# Patient Record
Sex: Male | Born: 1940 | Race: White | Hispanic: No | Marital: Married | Smoking: Former smoker
Health system: Southern US, Community
[De-identification: ages and names within clinical notes are randomized; demographics above are authoritative.]

## PROBLEM LIST (undated history)

## (undated) DIAGNOSIS — F419 Anxiety disorder, unspecified: Secondary | ICD-10-CM

## (undated) DIAGNOSIS — I2699 Other pulmonary embolism without acute cor pulmonale: Secondary | ICD-10-CM

## (undated) DIAGNOSIS — E7800 Pure hypercholesterolemia, unspecified: Secondary | ICD-10-CM

## (undated) DIAGNOSIS — J449 Chronic obstructive pulmonary disease, unspecified: Secondary | ICD-10-CM

## (undated) DIAGNOSIS — K259 Gastric ulcer, unspecified as acute or chronic, without hemorrhage or perforation: Secondary | ICD-10-CM

## (undated) DIAGNOSIS — K219 Gastro-esophageal reflux disease without esophagitis: Secondary | ICD-10-CM

## (undated) HISTORY — PX: HEMORRHOID SURGERY: SHX153

## (undated) HISTORY — PX: APPENDECTOMY: SHX54

---

## 2009-10-15 ENCOUNTER — Ambulatory Visit: Payer: Self-pay | Admitting: Radiology

## 2009-10-15 ENCOUNTER — Emergency Department (HOSPITAL_BASED_OUTPATIENT_CLINIC_OR_DEPARTMENT_OTHER): Admission: EM | Admit: 2009-10-15 | Discharge: 2009-10-15 | Payer: Self-pay | Admitting: Emergency Medicine

## 2011-03-30 LAB — CBC
HCT: 44.7 % (ref 39.0–52.0)
MCHC: 33.8 g/dL (ref 30.0–36.0)
MCV: 86.4 fL (ref 78.0–100.0)

## 2011-03-30 LAB — BASIC METABOLIC PANEL
BUN: 16 mg/dL (ref 6–23)
CO2: 30 mEq/L (ref 19–32)
Calcium: 9.2 mg/dL (ref 8.4–10.5)
GFR calc non Af Amer: >60 mL/min (ref >60)
Sodium: 143 mEq/L (ref 135–145)

## 2011-03-30 LAB — POCT CARDIAC MARKERS
CKMB, poc: 1.1 ng/mL (ref 1.0–8.0)
Myoglobin, poc: 90.4 ng/mL (ref 12–200)

## 2011-03-30 LAB — DIFFERENTIAL
Basophils Absolute: 0.1 10*3/uL (ref 0.0–0.1)
Monocytes Relative: 6 % (ref 3–12)
Neutro Abs: 4.3 10*3/uL (ref 1.7–7.7)

## 2012-12-24 ENCOUNTER — Emergency Department (HOSPITAL_BASED_OUTPATIENT_CLINIC_OR_DEPARTMENT_OTHER)
Admission: EM | Admit: 2012-12-24 | Discharge: 2012-12-25 | Disposition: A | Discharge status: Home / Self Care | Payer: Medicare Other | Attending: Emergency Medicine | Admitting: Emergency Medicine

## 2012-12-24 ENCOUNTER — Encounter (HOSPITAL_BASED_OUTPATIENT_CLINIC_OR_DEPARTMENT_OTHER): Payer: Self-pay

## 2012-12-24 ENCOUNTER — Emergency Department (HOSPITAL_BASED_OUTPATIENT_CLINIC_OR_DEPARTMENT_OTHER): Payer: Medicare Other

## 2012-12-24 DIAGNOSIS — E278 Other specified disorders of adrenal gland: Secondary | ICD-10-CM | POA: Insufficient documentation

## 2012-12-24 DIAGNOSIS — J4489 Other specified chronic obstructive pulmonary disease: Secondary | ICD-10-CM | POA: Insufficient documentation

## 2012-12-24 DIAGNOSIS — K259 Gastric ulcer, unspecified as acute or chronic, without hemorrhage or perforation: Secondary | ICD-10-CM | POA: Insufficient documentation

## 2012-12-24 DIAGNOSIS — Z7901 Long term (current) use of anticoagulants: Secondary | ICD-10-CM | POA: Insufficient documentation

## 2012-12-24 DIAGNOSIS — R109 Unspecified abdominal pain: Secondary | ICD-10-CM

## 2012-12-24 DIAGNOSIS — Z79899 Other long term (current) drug therapy: Secondary | ICD-10-CM | POA: Insufficient documentation

## 2012-12-24 DIAGNOSIS — Z86711 Personal history of pulmonary embolism: Secondary | ICD-10-CM | POA: Insufficient documentation

## 2012-12-24 DIAGNOSIS — K219 Gastro-esophageal reflux disease without esophagitis: Secondary | ICD-10-CM | POA: Insufficient documentation

## 2012-12-24 DIAGNOSIS — F411 Generalized anxiety disorder: Secondary | ICD-10-CM | POA: Insufficient documentation

## 2012-12-24 DIAGNOSIS — J449 Chronic obstructive pulmonary disease, unspecified: Secondary | ICD-10-CM | POA: Insufficient documentation

## 2012-12-24 DIAGNOSIS — R1084 Generalized abdominal pain: Secondary | ICD-10-CM | POA: Insufficient documentation

## 2012-12-24 DIAGNOSIS — Z87891 Personal history of nicotine dependence: Secondary | ICD-10-CM | POA: Insufficient documentation

## 2012-12-24 DIAGNOSIS — E7800 Pure hypercholesterolemia, unspecified: Secondary | ICD-10-CM | POA: Insufficient documentation

## 2012-12-24 HISTORY — DX: Gastro-esophageal reflux disease without esophagitis: K21.9

## 2012-12-24 HISTORY — DX: Chronic obstructive pulmonary disease, unspecified: J44.9

## 2012-12-24 HISTORY — DX: Other pulmonary embolism without acute cor pulmonale: I26.99

## 2012-12-24 HISTORY — DX: Gastric ulcer, unspecified as acute or chronic, without hemorrhage or perforation: K25.9

## 2012-12-24 HISTORY — DX: Anxiety disorder, unspecified: F41.9

## 2012-12-24 HISTORY — DX: Pure hypercholesterolemia, unspecified: E78.00

## 2012-12-24 LAB — CBC WITH DIFFERENTIAL/PLATELET
Basophils Relative: 1 % (ref 0–1)
HCT: 42.1 % (ref 39.0–52.0)
Lymphs Abs: 2.1 10*3/uL (ref 0.7–4.0)
MCV: 79 fL (ref 78.0–100.0)
Monocytes Relative: 9 % (ref 3–12)
Neutrophils Relative %: 55 % (ref 43–77)
RDW: 14.7 % (ref 11.5–15.5)
WBC: 6.7 10*3/uL (ref 4.0–10.5)

## 2012-12-24 MED ORDER — DIPHENHYDRAMINE HCL 50 MG/ML IJ SOLN
INTRAMUSCULAR | Status: AC
  Administered 2012-12-24: 12.5 mg via INTRAVENOUS
  Filled 2012-12-24: qty 1

## 2012-12-24 MED ORDER — SODIUM CHLORIDE 0.9 % IV SOLN
Freq: Once | INTRAVENOUS | Status: AC
  Administered 2012-12-24: 23:00:00 via INTRAVENOUS

## 2012-12-24 MED ORDER — DIPHENHYDRAMINE HCL 50 MG/ML IJ SOLN
12.5000 mg | Freq: Once | INTRAMUSCULAR | Status: AC
  Administered 2012-12-24: 12.5 mg via INTRAVENOUS

## 2012-12-24 MED ORDER — FENTANYL CITRATE 0.05 MG/ML IJ SOLN
50.0000 ug | Freq: Once | INTRAMUSCULAR | Status: AC
  Administered 2012-12-24: 50 ug via INTRAVENOUS
  Filled 2012-12-24: qty 2

## 2012-12-24 NOTE — ED Provider Notes (Addendum)
History     CSN: [id number]  Arrival date & time 12/24/12  1946   First MD Initiated Contact with Patient 12/24/12 2301      Chief Complaint  Patient presents with  . Abdominal Pain    (Consider location/radiation/quality/duration/timing/severity/associated sxs/prior treatment) Patient is a 71 y.o. male presenting with abdominal pain. The history is provided by the patient.  Abdominal Pain The primary symptoms of the illness include abdominal pain. The primary symptoms of the illness do not include fever, fatigue, shortness of breath, nausea, vomiting, diarrhea, hematemesis, hematochezia, dysuria or vaginal discharge. The current episode started 6 to 12 hours ago. The onset of the illness was sudden. The problem has not changed since onset. The abdominal pain began 6 to 12 hours ago. The pain came on suddenly. The abdominal pain has been unchanged since its onset. The abdominal pain is generalized. The abdominal pain does not radiate. The abdominal pain is relieved by nothing.  The patient states that she believes she is currently not pregnant. The patient has not had a change in bowel habit. Risk factors for an acute abdominal problem include being elderly. Symptoms associated with the illness do not include chills or anorexia. Significant associated medical issues include PUD.    Past Medical History  Diagnosis Date  . GERD (gastroesophageal reflux disease)   . Gastric ulcer   . Pulmonary embolism   . Anxiety   . COPD (chronic obstructive pulmonary disease)   . High cholesterol     Past Surgical History  Procedure Date  . Appendectomy   . Hemorrhoid surgery     No family history on file.  History  Substance Use Topics  . Smoking status: Former Games developer  . Smokeless tobacco: Not on file  . Alcohol Use: No      Review of Systems  Constitutional: Negative for fever, chills and fatigue.  Respiratory: Negative for shortness of breath.   Gastrointestinal: Positive for  abdominal pain. Negative for nausea, vomiting, diarrhea, hematochezia, anorexia and hematemesis.  Genitourinary: Negative for dysuria and vaginal discharge.  All other systems reviewed and are negative.    Allergies  Penicillins  Home Medications   Current Outpatient Rx  Name  Route  Sig  Dispense  Refill  . ALBUTEROL IN   Inhalation   Inhale into the lungs.         Garlon Hatchet HFA IN   Inhalation   Inhale into the lungs.         . OMEPRAZOLE PO   Oral   Take by mouth.         Marland Kitchen PRAVASTATIN SODIUM PO   Oral   Take by mouth.         Marland Kitchen TIOTROPIUM BROMIDE MONOHYDRATE 18 MCG IN CAPS   Inhalation   Place 18 mcg into inhaler and inhale daily.         . WARFARIN SODIUM PO   Oral   Take by mouth.           BP 120/81  Pulse 75  Temp 97.9 F (36.6 C) (Oral)  Resp 20  Ht 6\' 1"  (1.854 m)  Wt 161 lb (73.029 kg)  BMI 21.24 kg/m2  SpO2 95%  Physical Exam  Constitutional: He is oriented to person, place, and time. He appears well-developed and well-nourished. No distress.  HENT:  Head: Normocephalic and atraumatic.  Mouth/Throat: Oropharynx is clear and moist.  Eyes: Conjunctivae normal are normal. Pupils are equal, round, and reactive to light.  Neck: Normal range of motion. Neck supple.  Cardiovascular: Normal rate, regular rhythm and intact distal pulses.   Pulmonary/Chest: Effort normal and breath sounds normal. He has no wheezes. He has no rales.  Abdominal: Soft. Bowel sounds are normal. There is no tenderness. There is no rebound and no guarding.  Musculoskeletal: Normal range of motion.  Neurological: He is alert and oriented to person, place, and time.  Skin: Skin is warm and dry.  Psychiatric: He has a normal mood and affect.    ED Course  Procedures (including critical care time)   Labs Reviewed  CBC WITH DIFFERENTIAL  COMPREHENSIVE METABOLIC PANEL  LIPASE, BLOOD  URINALYSIS, ROUTINE W REFLEX MICROSCOPIC  PROTIME-INR   No results  found.   No diagnosis found.    MDM  Pain likely from coughing to get corn bread he choked on out of esophagus.  Pain free.  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  Patient and family verbalize understanding and agree to follow up.  Liquid diet until endoscopy.  Patient will require endoscopy.  Patient and family verbalize understanding and agree to follow up        Olar Santini K Rochele Lueck-Rasch, MD 12/25/12 0111  Marvin Grabill K Laney Louderback-Rasch, MD 12/25/12 [phone]

## 2012-12-24 NOTE — ED Notes (Signed)
C/o abd pain after getting choked on food tonight approx 530pm-pt no resp distress

## 2012-12-25 LAB — COMPREHENSIVE METABOLIC PANEL
ALT: 16 U/L (ref 0–53)
AST: 21 U/L (ref 0–37)
Albumin: 3.3 g/dL — ABNORMAL LOW (ref 3.5–5.2)
Alkaline Phosphatase: 96 U/L (ref 39–117)
BUN: 16 mg/dL (ref 6–23)
CO2: 25 mEq/L (ref 19–32)
Creatinine, Ser: 1 mg/dL (ref 0.50–1.35)
Potassium: 3.9 mEq/L (ref 3.5–5.1)
Total Protein: 6.9 g/dL (ref 6.0–8.3)

## 2012-12-25 LAB — URINALYSIS, ROUTINE W REFLEX MICROSCOPIC
Glucose, UA: NEGATIVE mg/dL
Hgb urine dipstick: NEGATIVE
pH: 5.5 (ref 5.0–8.0)

## 2012-12-25 LAB — LIPASE, BLOOD: Lipase: 21 U/L (ref 11–59)

## 2012-12-25 MED ORDER — GI COCKTAIL ~~LOC~~
ORAL | Status: AC
  Administered 2012-12-25: 30 mL via ORAL
  Filled 2012-12-25: qty 30

## 2012-12-25 MED ORDER — IOHEXOL 300 MG/ML  SOLN
50.0000 mL | Freq: Once | INTRAMUSCULAR | Status: AC | PRN
  Administered 2012-12-25: 50 mL via ORAL

## 2012-12-25 MED ORDER — IOHEXOL 300 MG/ML  SOLN
100.0000 mL | Freq: Once | INTRAMUSCULAR | Status: AC | PRN
  Administered 2012-12-25: 100 mL via INTRAVENOUS

## 2012-12-25 MED ORDER — GI COCKTAIL ~~LOC~~
30.0000 mL | Freq: Once | ORAL | Status: AC
  Administered 2012-12-25: 30 mL via ORAL

## 2012-12-25 MED ORDER — SUCRALFATE 1 GM/10ML PO SUSP: 1.0000 g | Freq: Four times a day (QID) | ORAL | Status: AC

## 2012-12-25 NOTE — ED Notes (Signed)
Patient called to state that he can not afford the medication that was prescribed.  Chart reviewed with Dr. Oletta Lamas.  No alterative medication to the sucralfate.  Could use OTC pepcid or gas x .  Called placed back to the pt, with this info.  Pt stated that he was able to get 1/2 of the prescription filled.  Encouraged to f/u with GI for further evaluation.

## 2020-06-25 ENCOUNTER — Other Ambulatory Visit: Payer: Self-pay

## 2020-06-25 ENCOUNTER — Emergency Department (HOSPITAL_BASED_OUTPATIENT_CLINIC_OR_DEPARTMENT_OTHER)
Admission: EM | Admit: 2020-06-25 | Discharge: 2020-06-25 | Disposition: A | Discharge status: Home / Self Care | Payer: Medicare Other | Attending: Emergency Medicine | Admitting: Emergency Medicine

## 2020-06-25 ENCOUNTER — Emergency Department (HOSPITAL_BASED_OUTPATIENT_CLINIC_OR_DEPARTMENT_OTHER): Payer: Medicare Other

## 2020-06-25 ENCOUNTER — Encounter (HOSPITAL_BASED_OUTPATIENT_CLINIC_OR_DEPARTMENT_OTHER): Payer: Self-pay | Admitting: *Deleted

## 2020-06-25 DIAGNOSIS — S0990XA Unspecified injury of head, initial encounter: Secondary | ICD-10-CM | POA: Diagnosis present

## 2020-06-25 DIAGNOSIS — Y998 Other external cause status: Secondary | ICD-10-CM | POA: Diagnosis not present

## 2020-06-25 DIAGNOSIS — Y9389 Activity, other specified: Secondary | ICD-10-CM | POA: Diagnosis not present

## 2020-06-25 DIAGNOSIS — Y9289 Other specified places as the place of occurrence of the external cause: Secondary | ICD-10-CM | POA: Diagnosis not present

## 2020-06-25 DIAGNOSIS — Z87891 Personal history of nicotine dependence: Secondary | ICD-10-CM | POA: Insufficient documentation

## 2020-06-25 DIAGNOSIS — R519 Headache, unspecified: Secondary | ICD-10-CM | POA: Insufficient documentation

## 2020-06-25 DIAGNOSIS — W19XXXA Unspecified fall, initial encounter: Secondary | ICD-10-CM | POA: Diagnosis not present

## 2020-06-25 LAB — BASIC METABOLIC PANEL
Anion gap: 9 (ref 5–15)
BUN: 21 mg/dL (ref 8–23)
CO2: 28 mmol/L (ref 22–32)
Calcium: 9.2 mg/dL (ref 8.9–10.3)
Chloride: 103 mmol/L (ref 98–111)
Creatinine, Ser: 1.07 mg/dL (ref 0.61–1.24)
GFR calc Af Amer: >60 mL/min (ref >60)
GFR calc non Af Amer: >60 mL/min (ref >60)
Glucose, Bld: 101 mg/dL — ABNORMAL HIGH (ref 70–99)
Potassium: 4.8 mmol/L (ref 3.5–5.1)
Sodium: 140 mmol/L (ref 135–145)

## 2020-06-25 LAB — CBC WITH DIFFERENTIAL/PLATELET
Abs Immature Granulocytes: 0.03 10*3/uL (ref 0.00–0.07)
Basophils Absolute: 0.1 10*3/uL (ref 0.0–0.1)
Basophils Relative: 1 %
Eosinophils Absolute: 0.1 10*3/uL (ref 0.0–0.5)
Eosinophils Relative: 2 %
HCT: 46.9 % (ref 39.0–52.0)
Hemoglobin: 14.9 g/dL (ref 13.0–17.0)
Immature Granulocytes: 1 %
Lymphocytes Relative: 36 %
Lymphs Abs: 2.4 10*3/uL (ref 0.7–4.0)
MCH: 26.4 pg (ref 26.0–34.0)
MCHC: 31.8 g/dL (ref 30.0–36.0)
MCV: 83 fL (ref 80.0–100.0)
Monocytes Absolute: 0.5 10*3/uL (ref 0.1–1.0)
Monocytes Relative: 8 %
Neutro Abs: 3.5 10*3/uL (ref 1.7–7.7)
Neutrophils Relative %: 52 %
Platelets: 205 10*3/uL (ref 150–400)
RBC: 5.65 MIL/uL (ref 4.22–5.81)
RDW: 14.5 % (ref 11.5–15.5)
WBC: 6.6 10*3/uL (ref 4.0–10.5)
nRBC: 0 % (ref 0.0–0.2)

## 2020-06-25 LAB — SEDIMENTATION RATE: Sed Rate: 11 mm/hr (ref 0–16)

## 2020-06-25 MED ORDER — ACETAMINOPHEN 325 MG PO TABS
650.0000 mg | ORAL_TABLET | Freq: Once | ORAL | Status: AC
  Administered 2020-06-25: 650 mg via ORAL
  Filled 2020-06-25: qty 2

## 2020-06-25 MED ORDER — METHOCARBAMOL 500 MG PO TABS: 500.0000 mg | ORAL_TABLET | Freq: Two times a day (BID) | ORAL | 0 refills | Status: AC | PRN

## 2020-06-25 NOTE — ED Triage Notes (Signed)
Pt co head injury x 3 weeks ago , h/a x 3 days

## 2020-06-25 NOTE — Discharge Instructions (Signed)
Your head CT and laboratory work-up here today was reassuring.  However, your ESR (inflammatory marker) is still pending.  I will call you when it results as you were eager to leave.  However, if it is greater than 50 you will need to need to see ophthalmology to arrange for a biopsy.  Please call the ophthalmologist listed above.  Otherwise, please take Tylenol.  You were given a prescription for Robaxin which is a muscle relaxer.  You should not drive, work, consume alcohol, or operate machinery while taking this medication as it can make you very drowsy.  Please be VERY careful because this puts you at risk for falls.  Please return to the ED or seek immediate medical attention should experience any new or symptoms.

## 2020-06-25 NOTE — ED Notes (Signed)
Fell two weeks ago, fell off bed, head hit wall. Has pain over left eye and left side of face. States pain began 3 days ago. Very alert and oriented x 4, no LOC, no dizziness, denies any N/V. MAE x 4

## 2020-06-25 NOTE — ED Provider Notes (Addendum)
[facility] EMERGENCY DEPARTMENT Provider Note   CSN: [id number] Arrival date & time: 06/25/20  1500     History Chief Complaint  Patient presents with  . Head Injury    Brian Reynolds is a 79 y.o. male with no significant past medical history presents the ED with a 3-day history of left-sided headache with extension down left side of face and neck.  He describes his pain as 8 out of 10 and aching 'twinge" in his neck.  He feels as though he has a kink in his neck and is wondering if muscle relaxants may help.  He states that he has never had this type of headache before.  He does report that he had a small fall approximately 3 weeks ago where he hit the top of his head on the wall.  He denies any LOC.  He is not on any blood thinners.  He denies any other injury or precipitating event.  Patient denies any fevers or chills, recent illness, meningismus, difficulty eating or drinking, trismus, difficulty swallowing, ear pain, blurred vision, weakness or numbness, other focal neurologic deficits, or other symptoms.  HPI     Past Medical History:  Diagnosis Date  . Anxiety   . COPD (chronic obstructive pulmonary disease) ([place])   . Gastric ulcer   . GERD (gastroesophageal reflux disease)   . High cholesterol   . Pulmonary embolism ([facility])     There are no problems to display for this patient.   Past Surgical History:  Procedure Laterality Date  . APPENDECTOMY    . HEMORRHOID SURGERY         No family history on file.  Social History   Tobacco Use  . Smoking status: Former Smoker  Substance Use Topics  . Alcohol use: No  . Drug use: Not on file    Home Medications Prior to Admission medications   Medication Sig Start Date End Date Taking? Authorizing Provider  ALBUTEROL IN Inhale into the lungs.    [provider]  Fluticasone-Salmeterol (ADVAIR HFA IN) Inhale into the lungs.    [provider]  methocarbamol (ROBAXIN) 500 MG tablet Take 1  tablet (500 mg total) by mouth 2 (two) times daily as needed for muscle spasms. 06/25/20   Corena Herter, PA-C  OMEPRAZOLE PO Take by mouth.    [provider]  PRAVASTATIN SODIUM PO Take by mouth.    [provider]  sucralfate (CARAFATE) 1 GM/10ML suspension Take 10 mLs (1 g total) by mouth 4 (four) times daily. 12/25/12   Palumbo, April, MD  tiotropium (SPIRIVA) 18 MCG inhalation capsule Place 18 mcg into inhaler and inhale daily.    [provider]  WARFARIN SODIUM PO Take by mouth.    [provider]    Allergies    Penicillins  Review of Systems   Review of Systems  Constitutional: Negative for fever.  Musculoskeletal: Positive for neck stiffness.  Skin: Negative for color change and rash.  Neurological: Positive for headaches. Negative for facial asymmetry, weakness and numbness.    Physical Exam Updated Vital Signs BP 129/85 (BP Location: Right Arm)   Pulse 76   Temp 98.7 F (37.1 C) (Oral)   Resp 18   Ht 6' 1"  (1.854 m)   Wt 78 kg   SpO2 100%   BMI 22.69 kg/m   Physical Exam Vitals and nursing note reviewed. Exam conducted with a chaperone present.  Constitutional:      General: He  is not in acute distress.    Appearance: Normal appearance. He is not ill-appearing.  HENT:     Head: Normocephalic and atraumatic.     Ears:     Comments: Normal.  No mastoid tenderness palpation. Eyes:     General: No scleral icterus.    Extraocular Movements: Extraocular movements intact.     Conjunctiva/sclera: Conjunctivae normal.     Pupils: Pupils are equal, round, and reactive to light.  Neck:     Comments: No midline cervical tenderness palpation.  No meningismus.  Mild left-sided facial/neck discomfort with palpation over area of temporal artery and with turning head in contralateral direction.  No redness, erythema, warmth, or other overlying skin changes. Cardiovascular:     Rate and Rhythm: Normal rate and regular rhythm.     Pulses:  Normal pulses.     Heart sounds: Normal heart sounds.  Pulmonary:     Effort: Pulmonary effort is normal.     Breath sounds: Normal breath sounds.  Skin:    General: Skin is dry.     Capillary Refill: Capillary refill takes less than 2 seconds.  Neurological:     Mental Status: He is alert.     GCS: GCS eye subscore is 4. GCS verbal subscore is 5. GCS motor subscore is 6.     Comments: CN II through XII grossly intact.  Alert and oriented x3.  Negative Romberg and cerebellar exams.  Move all extremities.  Strength intact against resistance.  Sensation intact throughout.  PERRL and EOM intact.  No nystagmus.  Psychiatric:        Mood and Affect: Mood normal.        Behavior: Behavior normal.        Thought Content: Thought content normal.     ED Results / Procedures / Treatments   Labs (all labs ordered are listed, but only abnormal results are displayed) Labs Reviewed  BASIC METABOLIC PANEL - Abnormal; Notable for the following components:      Result Value   Glucose, Bld 101 (*)    All other components within normal limits  CBC WITH DIFFERENTIAL/PLATELET  SEDIMENTATION RATE    EKG None  Radiology CT Head Wo Contrast  Result Date: 06/25/2020 CLINICAL DATA:  Headache after fall 3 days ago. EXAM: CT HEAD WITHOUT CONTRAST TECHNIQUE: Contiguous axial images were obtained from the base of the skull through the vertex without intravenous contrast. COMPARISON:  None. FINDINGS: Brain: No evidence of acute infarction, hemorrhage, hydrocephalus, extra-axial collection or mass lesion/mass effect. Mild generalized cerebral atrophy. Vascular: Atherosclerotic vascular calcification of the carotid siphons. No hyperdense vessel. Skull: Normal. Negative for fracture or focal lesion. Sinuses/Orbits: No acute finding. Other: None. IMPRESSION: 1. No acute intracranial abnormality. Electronically Signed   By: Titus Dubin M.D.   On: 06/25/2020 16:05    Procedures Procedures (including critical  care time)  Medications Ordered in ED Medications  acetaminophen (TYLENOL) tablet 650 mg (650 mg Oral Given 06/25/20 1758)    ED Course  I have reviewed the triage vital signs and the nursing notes.  Pertinent labs & imaging results that were available during my care of the patient were reviewed by me and considered in my medical decision making (see chart for details).    MDM Rules/Calculators/A&P                          CT head without contrast was ordered while patient was in triage.  I personally reviewed the CT which demonstrates no acute intracranial abnormalities.  No concern for intracranial hemorrhage or subdural hematoma.  While there was no CT cervical spine obtained, patient's physical exam was relatively unremarkable and there was no midline cervical TTP.  Patient's neurologic exam is entirely benign.  Patient is ambulating without any ataxia or difficulty.  Do not feel as though further imaging is warranted.    Patient did have tenderness in the area of his temporal artery.  While he was frustrated and in a rush to leave, emphasized the importance of getting basic laboratory work-up including ESR to assess for inflammatory etiology.  Primary concern is temporal arteritis and if he has elevated ESR I will refer him for temporal artery biopsy as well as management with methylprednisolone.  Patient denies any fevers, history of PMR, jaw claudication, or vision loss.  No rash otherwise concerning for shingles.  If ESR is negative, patient is safe for discharge.  He feels as though he tweaked his neck and is requesting muscle relaxants.  He states that he took his wife's muscle relaxants in addition to Tylenol which he reports helped improve his symptoms.  His wife suggests that his discomfort is from resting with that side of his face on his fist, which he reportedly does often.  Dr. Gilford Raid personally evaluated patient and agrees with assessment and plan.  All of the evaluation and  work-up results were discussed with the patient and any family at bedside.  Patient and/or family were informed that while patient is appropriate for discharge at this time, some medical emergencies may only develop or become detectable after a period of time.  I specifically instructed patient and/or family to return to return to the ED or seek immediate medical attention for any new or worsening symptoms.  They were provided opportunity to ask any additional questions and have none at this time.  Prior to discharge patient is feeling well, agreeable with plan for discharge home.  They have expressed understanding of verbal discharge instructions as well as return precautions and are agreeable to the plan.   Patient counseled to never drive or operate heavy machinery while taking narcotic or other sedating medication.  I cautioned him on the risks of taking a muscle relaxant to 79, but he was adamant that is improved his symptoms and that he would take it at night before bed.    EDIT: Patient feels improved after receiving 60 mg Tylenol here in the ED.  ESR had to be sent to [facility] and is still pending, patient wants to go home.  I will personally call patient if his sedimentation rate is >50 mm/hr and concerning for temporal arteritis as he would require biopsy and received methylprednisolone IV in the hospital.  I was also concerned for possible vertebral dissection, however patient has noted much improvement with Tylenol and Robaxin.  Patient did not want further imaging (he did not even want to wait for laboratory testing) and I have lower suspicion at this time.  However, he was encouraged to return to ED if his symptoms failed to improve or worsened.    Final Clinical Impression(s) / ED Diagnoses Final diagnoses:  Acute nonintractable headache, unspecified headache type    Rx / DC Orders ED Discharge Orders         Ordered    methocarbamol (ROBAXIN) 500 MG tablet  2 times daily  PRN     Discontinue  Reprint     06/25/20 1821  Corena Herter, PA-C 06/25/20 1823    Corena Herter, PA-C 06/25/20 Salem Caster, MD 06/25/20 2034

## 2020-06-25 NOTE — ED Notes (Signed)
AVS given to pt along with Rx by ED provider. Opportunity for questions provided

## 2020-06-25 NOTE — ED Notes (Signed)
Pt ambulating in hall, requesting to be discharged, explained the ED provider will review his lab work and chart. PA informed that results have been posted.

## 2021-09-03 IMAGING — CT CT HEAD W/O CM
3 series · 16 of 47 positions shown, 19 images · non-contrast
Comparison: None.

CLINICAL DATA: Headache after fall 3 days ago.

EXAM:
CT HEAD WITHOUT CONTRAST
TECHNIQUE: Contiguous axial images were obtained from the base of the skull
through the vertex without intravenous contrast.

[Series 2: head wo · axial · 0.49mm/px · z∈[-162,-27]mm · 10 of 33 slices shown, 13 images]
[im 3/33  brain]
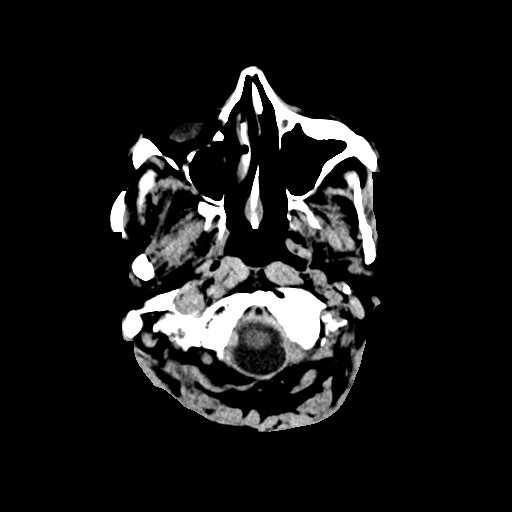
[im 3/33  bone]
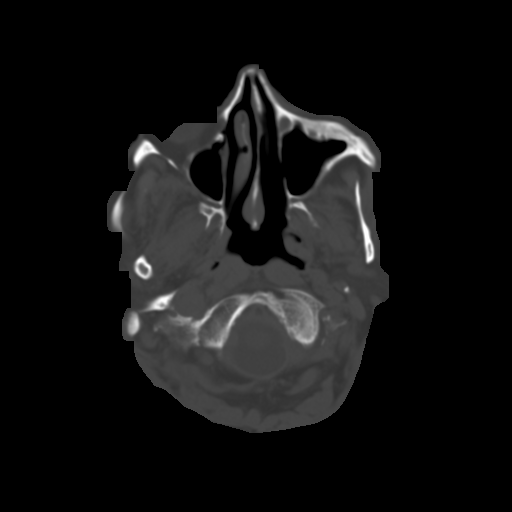
[im 6/33  brain]
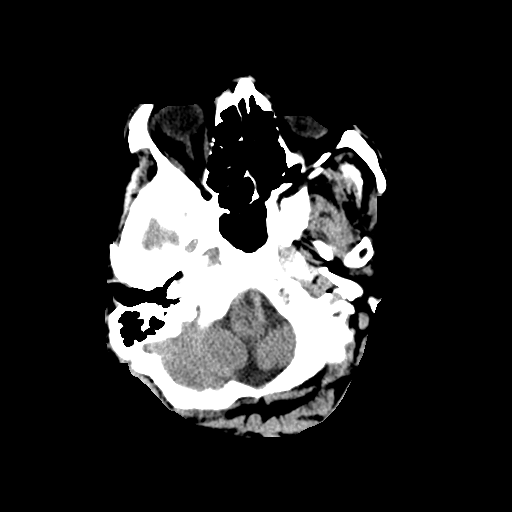
[im 9/33  brain]
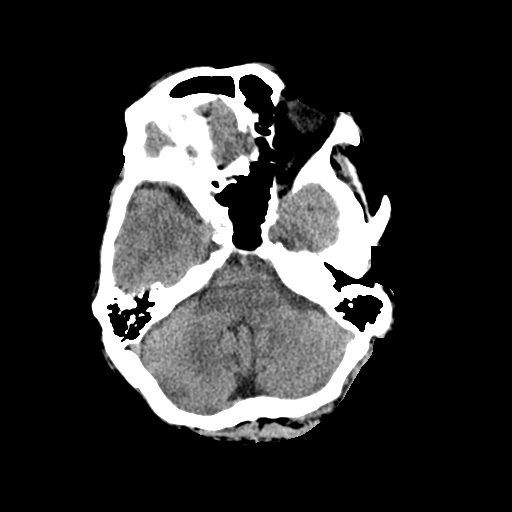
[im 12/33  brain]
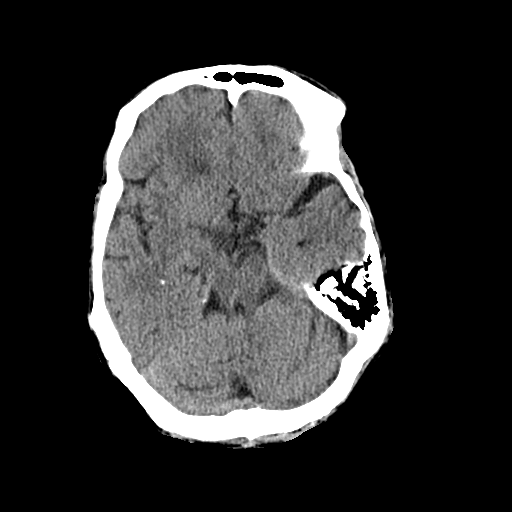
[im 15/33  brain]
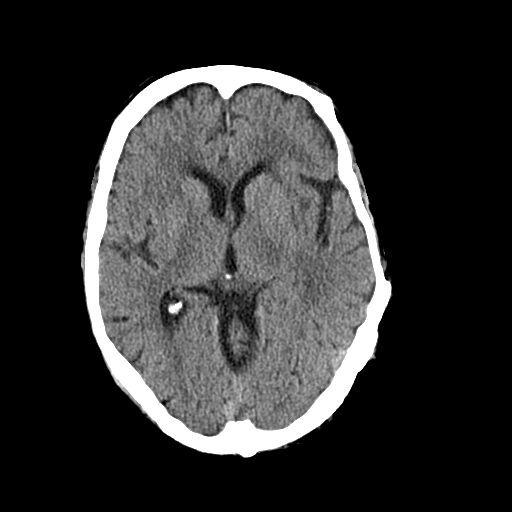
[im 15/33  bone]
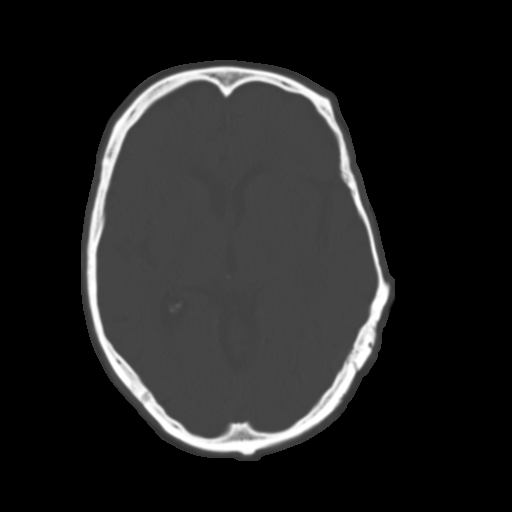
[im 18/33  brain]
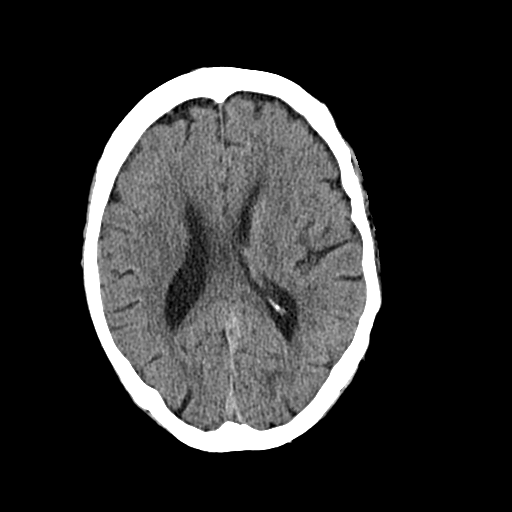
[im 21/33  brain]
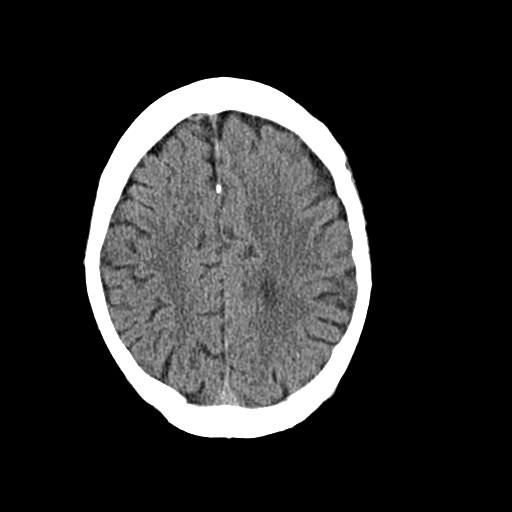
[im 25/33  brain]
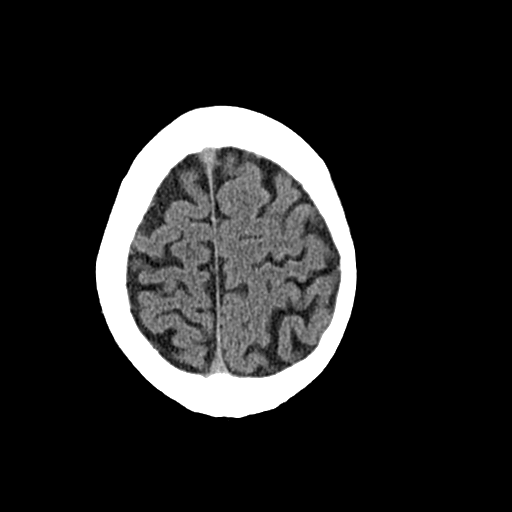
[im 27/33  brain]
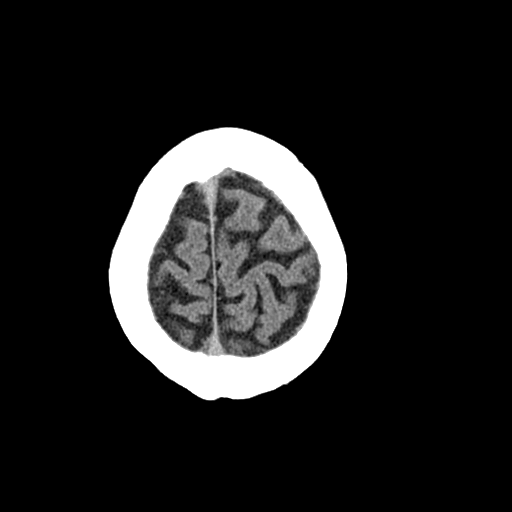
[im 27/33  bone]
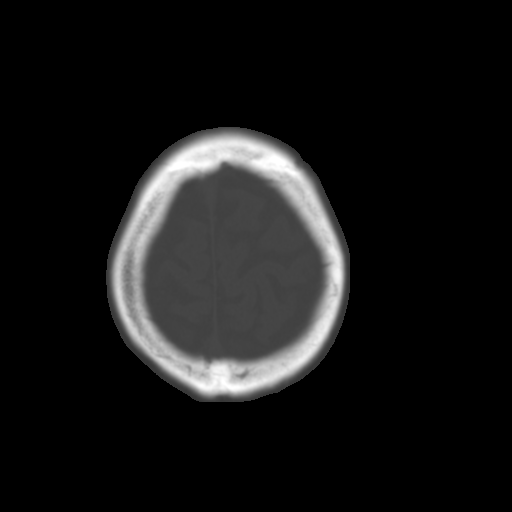
[im 30/33  brain]
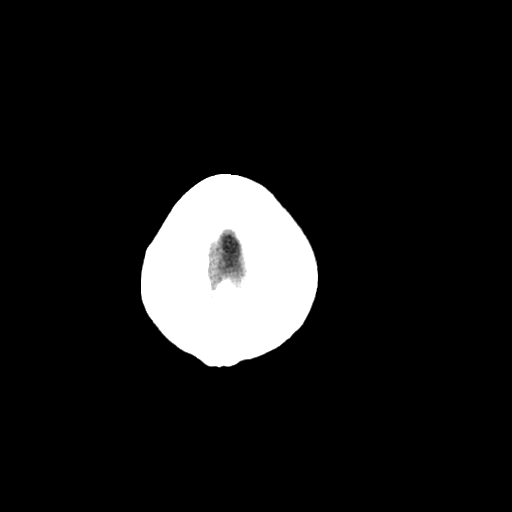

[Series 4: cor soft · coronal · 0.39mm/px · 3 of 71 slices shown]
[im 24/71  brain]
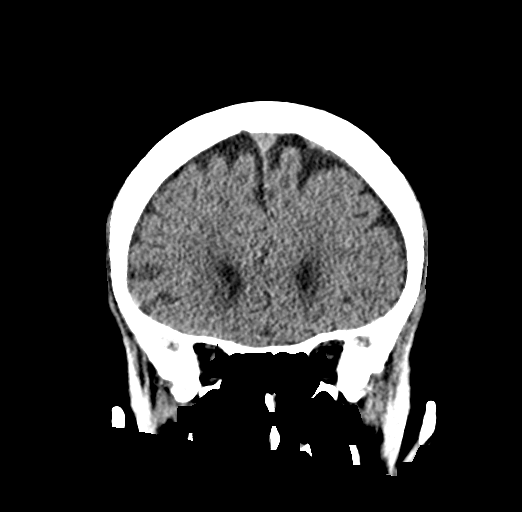
[im 32/71  brain]
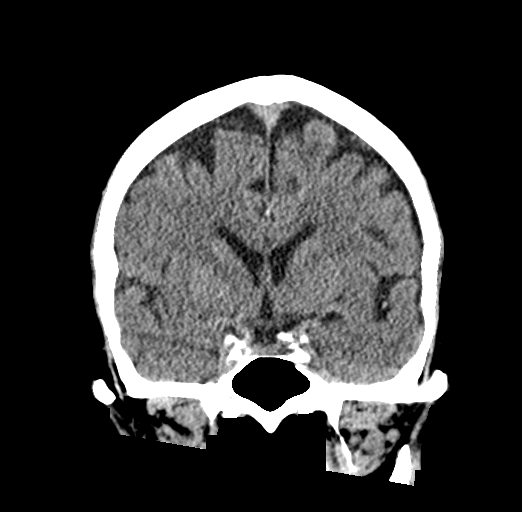
[im 39/71  brain]
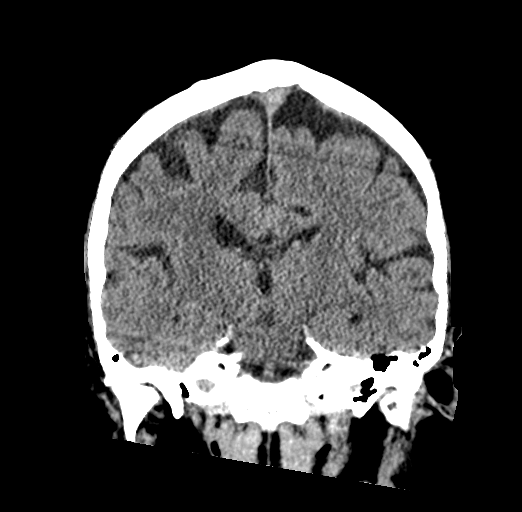

[Series 5: sag soft · sagittal · 0.39mm/px · 3 of 55 slices shown]
[im 21/55  brain]
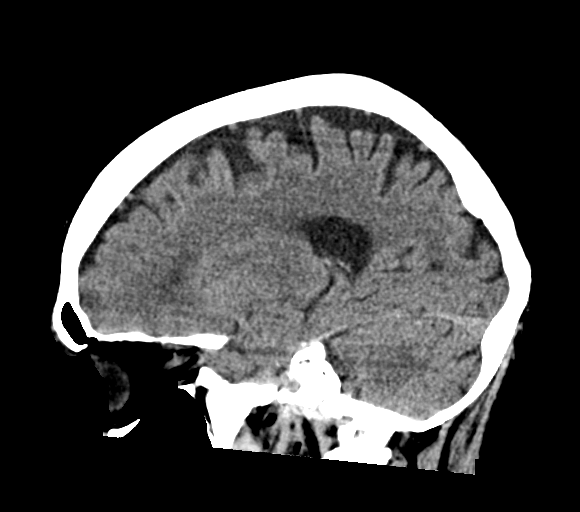
[im 28/55  brain]
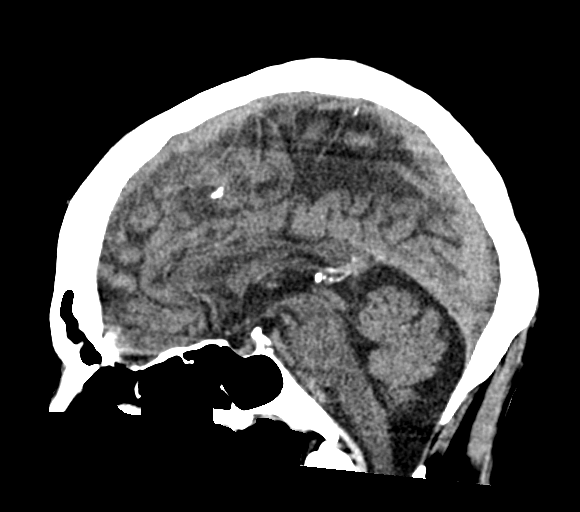
[im 34/55  brain]
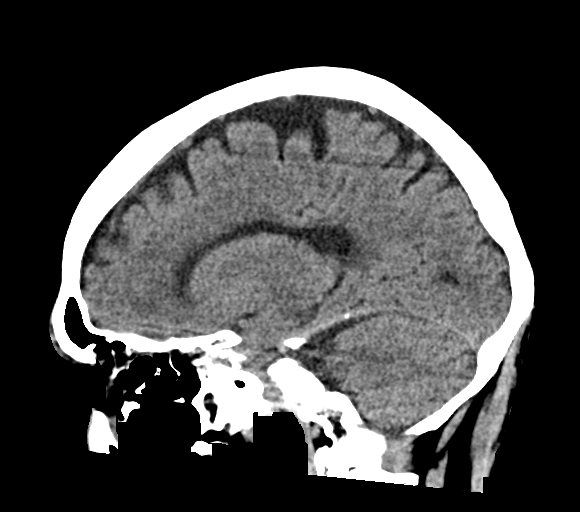

[16 of 47 positions shown; findings below may reference images not displayed]

FINDINGS: Brain: No evidence of acute infarction, hemorrhage, hydrocephalus,
extra-axial collection or mass lesion/mass effect. Mild generalized
cerebral atrophy.

Vascular: Atherosclerotic vascular calcification of the carotid
siphons. No hyperdense vessel.

Skull: Normal. Negative for fracture or focal lesion.

Sinuses/Orbits: No acute finding.

Other: None.
IMPRESSION: 1. No acute intracranial abnormality.
